# Patient Record
Sex: Male | Born: 1975 | Race: White | Hispanic: No | Marital: Married | Smoking: Former smoker
Health system: Southern US, Community
[De-identification: ages and names within clinical notes are randomized; demographics above are authoritative.]

## PROBLEM LIST (undated history)

## (undated) DIAGNOSIS — B019 Varicella without complication: Secondary | ICD-10-CM

## (undated) HISTORY — PX: WISDOM TOOTH EXTRACTION: SHX21

## (undated) HISTORY — DX: Varicella without complication: B01.9

---

## 1998-07-11 ENCOUNTER — Emergency Department (HOSPITAL_COMMUNITY): Admission: EM | Admit: 1998-07-11 | Discharge: 1998-07-11 | Payer: Self-pay | Admitting: Emergency Medicine

## 2001-02-03 ENCOUNTER — Emergency Department (HOSPITAL_COMMUNITY): Admission: EM | Admit: 2001-02-03 | Discharge: 2001-02-04 | Payer: Self-pay | Admitting: Emergency Medicine

## 2008-05-11 ENCOUNTER — Emergency Department (HOSPITAL_BASED_OUTPATIENT_CLINIC_OR_DEPARTMENT_OTHER): Admission: EM | Admit: 2008-05-11 | Discharge: 2008-05-11 | Payer: Self-pay | Admitting: Emergency Medicine

## 2014-03-10 ENCOUNTER — Encounter: Payer: Self-pay | Admitting: Internal Medicine

## 2014-03-10 ENCOUNTER — Ambulatory Visit (INDEPENDENT_AMBULATORY_CARE_PROVIDER_SITE_OTHER): Payer: No Typology Code available for payment source | Admitting: Internal Medicine

## 2014-03-10 ENCOUNTER — Telehealth: Payer: Self-pay | Admitting: Internal Medicine

## 2014-03-10 ENCOUNTER — Ambulatory Visit
Admission: RE | Admit: 2014-03-10 | Discharge: 2014-03-10 | Disposition: A | Discharge status: Home / Self Care | Payer: No Typology Code available for payment source | Source: Ambulatory Visit | Attending: Internal Medicine | Admitting: Internal Medicine

## 2014-03-10 VITALS — BP 118/78 | HR 64 | Temp 98.1°F | Ht 72.33 in | Wt 190.5 lb

## 2014-03-10 DIAGNOSIS — M25429 Effusion, unspecified elbow: Secondary | ICD-10-CM

## 2014-03-10 DIAGNOSIS — M25521 Pain in right elbow: Secondary | ICD-10-CM

## 2014-03-10 DIAGNOSIS — M25529 Pain in unspecified elbow: Secondary | ICD-10-CM

## 2014-03-10 DIAGNOSIS — M25421 Effusion, right elbow: Secondary | ICD-10-CM

## 2014-03-10 DIAGNOSIS — Z23 Encounter for immunization: Secondary | ICD-10-CM

## 2014-03-10 NOTE — Progress Notes (Signed)
Pre visit review using our clinic review tool, if applicable. No additional management support is needed unless otherwise documented below in the visit note. 

## 2014-03-10 NOTE — Patient Instructions (Addendum)
Olecranon Bursitis Bursitis is swelling and soreness (inflammation) of a fluid-filled sac (bursa) that covers and protects a joint. Olecranon bursitis occurs over the elbow.  CAUSES Bursitis can be caused by injury, overuse of the joint, arthritis, or infection.  SYMPTOMS   Tenderness, swelling, warmth, or redness over the elbow.  Elbow pain with movement. This is greater with bending the elbow.  Squeaking sound when the bursa is rubbed or moved.  Increasing size of the bursa without pain or discomfort.  Fever with increasing pain and swelling if the bursa becomes infected. HOME CARE INSTRUCTIONS   Put ice on the affected area.  Put ice in a plastic bag.  Place a towel between your skin and the bag.  Leave the ice on for 15-20 minutes each hour while awake. Do this for the first 2 days.  When resting, elevate your elbow above the level of your heart. This helps reduce swelling.  Continue to put the joint through a full range of motion 4 times per day. Rest the injured joint at other times. When the pain lessens, begin normal slow movements and usual activities.  Only take over-the-counter or prescription medicines for pain, discomfort, or fever as directed by your caregiver.  Reduce your intake of milk and related dairy products (cheese, yogurt). They may make your condition worse. SEEK IMMEDIATE MEDICAL CARE IF:   Your pain increases even during treatment.  You have a fever.  You have heat and inflammation over the bursa and elbow.  You have a red line that goes up your arm.  You have pain with movement of your elbow. MAKE SURE YOU:   Understand these instructions.  Will watch your condition.  Will get help right away if you are not doing well or get worse. Document Released: 11/15/2006 Document Revised: 01/08/2012 Document Reviewed: 10/01/2007 Exit[employer] Patient Information 2014 [employer].  

## 2014-03-10 NOTE — Telephone Encounter (Signed)
Relevant patient education mailed to patient.  

## 2014-03-10 NOTE — Addendum Note (Signed)
Addended by: Ellamae Sia on: 03/10/2014 11:44 AM   Modules accepted: Orders

## 2014-03-10 NOTE — Addendum Note (Signed)
Addended by: Lurlean Nanny on: 03/10/2014 11:55 AM   Modules accepted: Orders

## 2014-03-10 NOTE — Progress Notes (Signed)
   Subjective:    Patient ID: Keith Coleman, male    DOB: Sep 13, 1976, 38 y.o.   MRN: [id number]  HPI  Pt presents to the clinic today to establish care. He has not had a PCP in many years. He does have some concerns today about pain/swelling of his right elbow. He has noticed this since this last winter. Sometimes it swells more than others. He has not tried anything to fix it OTC.  Flu: never Tetanus: more than 10 years ago Dentist: as needed  Review of Systems      Past Medical History  Diagnosis Date  . Chicken pox     No current outpatient prescriptions on file.   No current facility-administered medications for this visit.    Allergies  Allergen Reactions  . Penicillins Hives    Family History  Problem Relation Age of Onset  . Alcohol abuse Father   . Cancer Father     colon  . Cancer Maternal Grandfather     liver    History   Social History  . Marital Status: Single    Spouse Name: N/A    Number of Children: N/A  . Years of Education: N/A   Occupational History  . Not on file.   Social History Main Topics  . Smoking status: Current Every Day Smoker -- 0.50 packs/day    Types: Cigarettes  . Smokeless tobacco: Former Systems developer    Types: Snuff  . Alcohol Use: Yes     Comment: occasional  . Drug Use: No  . Sexual Activity: Not on file   Other Topics Concern  . Not on file   Social History Narrative  . No narrative on file     Constitutional: Denies fever, malaise, fatigue, headache or abrupt weight changes.  Musculoskeletal: Pt reports right elbow swelling. Denies decrease in range of motion, difficulty with gait, muscle pain.   No other specific complaints in a complete review of systems (except as listed in HPI above).  Objective:   Physical Exam   BP 118/78  Pulse 64  Temp(Src) 98.1 F (36.7 C) (Oral)  Ht 6' 0.33" (1.837 m)  Wt 190 lb 8 oz (86.41 kg)  BMI 25.61 kg/m2 Wt Readings from Last 3 Encounters:  03/10/14 190 lb 8 oz  (86.41 kg)    General: Appears his stated age, well developed, well nourished in NAD. Skin: Warm, dry and intact. No rashes, lesions or ulcerations noted. Cardiovascular: Normal rate and rhythm. S1,S2 noted.  No murmur, rubs or gallops noted. No JVD or BLE edema. No carotid bruits noted. Pulmonary/Chest: Normal effort and positive vesicular breath sounds. No respiratory distress. No wheezes, rales or ronchi noted.  Musculoskeletal: Normal flexion and extension of the right elbow. + effusion noted. Palpable floating mass noted.         Assessment & Plan:   Right elbow effusion/pain:  Will obtain xray of right elbow to r/o foreign body vs broken bone Aleve as needed for pain/inflammation See procedure note for aspiration of effusion  Aspiration of olecranon bursitis:  Discussed risk of procedure with patient including reaccumulation of fluid, pain, possible infection and bleeding Pt understands and verbally agrees Right elbow cleaned with betadine x 2 Approx 5 ml serous fluid aspirated using 21 g needle Pt tolerated procedure well Area covered with triple antibiotic ointment and bandaid  RTC for your annual physical at your convienince

## 2016-07-28 DIAGNOSIS — M545 Low back pain: Secondary | ICD-10-CM | POA: Diagnosis not present

## 2016-07-28 DIAGNOSIS — M9903 Segmental and somatic dysfunction of lumbar region: Secondary | ICD-10-CM | POA: Diagnosis not present

## 2016-07-28 DIAGNOSIS — M9902 Segmental and somatic dysfunction of thoracic region: Secondary | ICD-10-CM | POA: Diagnosis not present

## 2016-07-28 DIAGNOSIS — M9901 Segmental and somatic dysfunction of cervical region: Secondary | ICD-10-CM | POA: Diagnosis not present

## 2017-04-02 ENCOUNTER — Ambulatory Visit: Payer: No Typology Code available for payment source | Admitting: Internal Medicine

## 2017-04-12 ENCOUNTER — Encounter: Payer: Self-pay | Admitting: Internal Medicine

## 2017-04-12 ENCOUNTER — Ambulatory Visit (INDEPENDENT_AMBULATORY_CARE_PROVIDER_SITE_OTHER): Payer: BLUE CROSS/BLUE SHIELD | Admitting: Internal Medicine

## 2017-04-12 VITALS — BP 114/78 | HR 60 | Temp 98.4°F | Ht 73.0 in | Wt 201.8 lb

## 2017-04-12 DIAGNOSIS — Z800 Family history of malignant neoplasm of digestive organs: Secondary | ICD-10-CM | POA: Diagnosis not present

## 2017-04-12 DIAGNOSIS — Z0000 Encounter for general adult medical examination without abnormal findings: Secondary | ICD-10-CM | POA: Diagnosis not present

## 2017-04-12 DIAGNOSIS — H9312 Tinnitus, left ear: Secondary | ICD-10-CM | POA: Diagnosis not present

## 2017-04-12 LAB — CBC
HEMATOCRIT: 43.2 % (ref 39.0–52.0)
Hemoglobin: 15.4 g/dL (ref 13.0–17.0)
MCHC: 35.5 g/dL (ref 30.0–36.0)
MCV: 96.8 fl (ref 78.0–100.0)
Platelets: 261 10*3/uL (ref 150.0–400.0)
RBC: 4.47 Mil/uL (ref 4.22–5.81)
RDW: 12.7 % (ref 11.5–15.5)
WBC: 3.9 10*3/uL — ABNORMAL LOW (ref 4.0–10.5)

## 2017-04-12 LAB — COMPREHENSIVE METABOLIC PANEL
ALK PHOS: 38 U/L — AB (ref 39–117)
ALT: 21 U/L (ref 0–53)
AST: 18 U/L (ref 0–37)
Albumin: 4.5 g/dL (ref 3.5–5.2)
BUN: 16 mg/dL (ref 6–23)
CO2: 28 meq/L (ref 19–32)
Calcium: 9.9 mg/dL (ref 8.4–10.5)
Chloride: 101 mEq/L (ref 96–112)
Creatinine, Ser: 1.18 mg/dL (ref 0.40–1.50)
GFR: 72.61 mL/min (ref >60.00)
Glucose, Bld: 92 mg/dL (ref 70–99)
POTASSIUM: 4.5 meq/L (ref 3.5–5.1)
Sodium: 136 mEq/L (ref 135–145)
Total Bilirubin: 0.6 mg/dL (ref 0.2–1.2)
Total Protein: 7.4 g/dL (ref 6.0–8.3)

## 2017-04-12 LAB — LIPID PANEL
CHOLESTEROL: 197 mg/dL (ref 0–200)
HDL: 55.9 mg/dL (ref >39.00)
LDL Cholesterol: 127 mg/dL — ABNORMAL HIGH (ref 0–99)
NONHDL: 140.6
Total CHOL/HDL Ratio: 4
Triglycerides: 66 mg/dL (ref 0.0–149.0)
VLDL: 13.2 mg/dL (ref 0.0–40.0)

## 2017-04-12 NOTE — Progress Notes (Signed)
HPI  Pt presents to the clinic today to Keith Coleman care. He has not been seen in this clinic or anywhere else in the last 3 years.  Flu: never Tetanus: 2015 Vision Screening: as needed Dentist: biannually  He does c/o intermittent ringing in his left ear. He reports this started after a weapon discharged while he was serving in the [employer] 2000-2004. It is not bothering him now, he reports he just wanted to mention it for the record.  He also reports he has a strong family history of colon cancer. His father and 2 of his uncles have all had colon cancer. He is interested in getting colon cancer screening now that he is 3.  Diet: He does eat meat. He consumes fruits and veggies daily. He occasionally eats frieds foods. He drinks mostly water. Exercise: [employer] 3-4 days a week  Past Medical History:  Diagnosis Date  . Chicken pox     Current Outpatient Prescriptions  Medication Sig Dispense Refill  . Creatine POWD Take by mouth.    . Multiple Vitamin (MULTIVITAMIN) tablet Take 1 tablet by mouth daily.    . Omega-3 1000 MG CAPS Take 2 capsules by mouth daily.    Marland Kitchen OVER THE COUNTER MEDICATION Tumeric    . Protein POWD Take by mouth.     No current facility-administered medications for this visit.     No Known Allergies  Family History  Problem Relation Age of Onset  . Alcohol abuse Father   . Cancer Father        colon  . Cancer Maternal Grandfather        liver    Social History   Social History  . Marital status: Single    Spouse name: N/A  . Number of children: N/A  . Years of education: N/A   Occupational History  . Not on file.   Social History Main Topics  . Smoking status: Former Smoker    Packs/day: 0.50    Years: 15.00    Types: Cigarettes  . Smokeless tobacco: Former Systems developer    Types: Snuff     Comment: quit 2016  . Alcohol use 3.0 oz/week    5 Cans of beer per week     Comment: occasional  . Drug use: No  . Sexual activity: Yes   Other Topics  Concern  . Not on file   Social History Narrative  . No narrative on file    ROS:  Constitutional: Denies fever, malaise, fatigue, headache or abrupt weight changes.  HEENT: Pt reports ringing in left ear. Denies eye pain, eye redness, ear pain, wax buildup, runny nose, nasal congestion, bloody nose, or sore throat. Respiratory: Denies difficulty breathing, shortness of breath, cough or sputum production.   Cardiovascular: Denies chest pain, chest tightness, palpitations or swelling in the hands or feet.  Gastrointestinal: Denies abdominal pain, bloating, constipation, diarrhea or blood in the stool.  GU: Denies frequency, urgency, pain with urination, blood in urine, odor or discharge. Musculoskeletal: Denies decrease in range of motion, difficulty with gait, muscle pain or joint pain and swelling.  Skin: Denies redness, rashes, lesions or ulcercations.  Neurological: Denies dizziness, difficulty with memory, difficulty with speech or problems with balance and coordination.  Psych: Denies anxiety, depression, SI/HI.  No other specific complaints in a complete review of systems (except as listed in HPI above).  PE:  BP 114/78   Pulse 60   Temp 98.4 F (36.9 C) (Oral)   Ht _0  (  1.854 m)   Wt 201 lb 12 oz (91.5 kg)   SpO2 98%   BMI 26.62 kg/m  Wt Readings from Last 3 Encounters:  04/12/17 201 lb 12 oz (91.5 kg)  03/10/14 190 lb 8 oz (86.4 kg)    General: Appears his stated age, well developed, well nourished in NAD. HEENT: Head: normal shape and size; Eyes: sclera white, no icterus, conjunctiva pink, PERRLA and EOMs intact; Ears: Tm's gray and intact, normal light reflex; Throat/Mouth: Teeth present, mucosa pink and moist, no lesions or ulcerations noted.  Neck: Neck supple, trachea midline. No masses, lumps or thyromegaly present.  Cardiovascular: Normal rate and rhythm. S1,S2 noted.  No murmur, rubs or gallops noted. No JVD or BLE edema.  Pulmonary/Chest: Normal effort and  positive vesicular breath sounds. No respiratory distress. No wheezes, rales or ronchi noted.  Abdomen: Soft and nontender. Normal bowel sounds, no bruits noted. No distention or masses noted. Liver, spleen and kidneys non palpable. Musculoskeletal: Strength 5/5 BUE/BLE. No difficulty with gait.  Neurological: Alert and oriented. Cranial nerves II-XII grossly intact. Coordination normal.  Psychiatric: Mood and affect normal. Behavior is normal. Judgment and thought content normal.    Assessment and Plan:  Preventative Health Maintenance:  Encouraged him to get a flu shot in the fall Tetanus UTD Information given on Cologuard, will order and have kit sent to his house Encouraged him to consume a balanced diet and exercise regimen Advised him to see an eye doctor and dentist annually Will check CBC, CMET, Lipid profile today  RTC in 1 year, sooner if needed Webb Silversmith, NP

## 2017-04-12 NOTE — Patient Instructions (Signed)

## 2018-12-10 ENCOUNTER — Encounter: Payer: Self-pay | Admitting: Internal Medicine

## 2018-12-10 ENCOUNTER — Ambulatory Visit: Payer: BLUE CROSS/BLUE SHIELD | Admitting: Internal Medicine

## 2018-12-10 VITALS — BP 120/78 | HR 68 | Temp 98.7°F | Wt 211.0 lb

## 2018-12-10 DIAGNOSIS — R51 Headache: Secondary | ICD-10-CM

## 2018-12-10 DIAGNOSIS — J029 Acute pharyngitis, unspecified: Secondary | ICD-10-CM | POA: Diagnosis not present

## 2018-12-10 DIAGNOSIS — R519 Headache, unspecified: Secondary | ICD-10-CM

## 2018-12-10 NOTE — Patient Instructions (Signed)
Sore Throat  A sore throat is pain, burning, irritation, or scratchiness in the throat. When you have a sore throat, you may feel pain or tenderness in your throat when you swallow or talk.  Many things can cause a sore throat, including:   An infection.   Seasonal allergies.   Dryness in the air.   Irritants, such as smoke or pollution.   Radiation treatment to the area.   Gastroesophageal reflux disease (GERD).   A tumor.  A sore throat is often the first sign of another sickness. It may happen with other symptoms, such as coughing, sneezing, fever, and swollen neck glands. Most sore throats go away without medical treatment.  Follow these instructions at home:          Take over-the-counter medicines only as told by your health care provider.  ? If your child has a sore throat, do not give your child aspirin because of the association with Reye syndrome.   Drink enough fluids to keep your urine pale yellow.   Rest as needed.   To help with pain, try:  ? Sipping warm liquids, such as broth, herbal tea, or warm water.  ? Eating or drinking cold or frozen liquids, such as frozen ice pops.  ? Gargling with a salt-water mixture 3-4 times a day or as needed. To make a salt-water mixture, completely dissolve -1 tsp (3-6 g) of salt in 1 cup (237 mL) of warm water.  ? Sucking on hard candy or throat lozenges.  ? Putting a cool-mist humidifier in your bedroom at night to moisten the air.  ? Sitting in the bathroom with the door closed for 5-10 minutes while you run hot water in the shower.   Do not use any products that contain nicotine or tobacco, such as cigarettes, e-cigarettes, and chewing tobacco. If you need help quitting, ask your health care provider.   Wash your hands well and often with soap and water. If soap and water are not available, use hand sanitizer.  Contact a health care provider if:   You have a fever for more than 2-3 days.   You have symptoms that last (are persistent) for more than  2-3 days.   Your throat does not get better within 7 days.   You have a fever and your symptoms suddenly get worse.   Your child who is 3 months to 3 years old has a temperature of 102.2F (39C) or higher.  Get help right away if:   You have difficulty breathing.   You cannot swallow fluids, soft foods, or your saliva.   You have increased swelling in your throat or neck.   You have persistent nausea and vomiting.  Summary   A sore throat is pain, burning, irritation, or scratchiness in the throat. Many things can cause a sore throat.   Take over-the-counter medicines only as told by your health care provider. Do not give your child aspirin.   Drink plenty of fluids, and rest as needed.   Contact a health care provider if your symptoms worsen or your sore throat does not get better within 7 days.  This information is not intended to replace advice given to you by your health care provider. Make sure you discuss any questions you have with your health care provider.  Document Released: 11/23/2004 Document Revised: 03/18/2018 Document Reviewed: 03/18/2018  Elsevier Interactive Patient Education  2019 [employer]

## 2018-12-10 NOTE — Progress Notes (Signed)
Subjective:    Patient ID: Keith Coleman, male    DOB: [birth year], 43 y.o.   MRN: [id number]  HPI  Pt presents to the clinic today with c/o headache and sore throat. He reports this started 2-3 days ago. The headache is located. He describes the pain as pressure located in the forehead. He denies visual changes, dizziness, sensitivity to light or sound. He denies difficulty swallowing but is having pain with swallowing. He denies runny nose, nasal congestion, ear pain or cough. He denies fever, chills or body aches. He has tried Ibuprofen and Nyquil with minimal relief. He has had sick contacts. He also recently cleaned out his chicken coop.  Review of Systems      Past Medical History:  Diagnosis Date  . Chicken pox     Current Outpatient Medications  Medication Sig Dispense Refill  . Creatine POWD Take by mouth.    . ELDERBERRY PO Take by mouth.    . Multiple Vitamin (MULTIVITAMIN) tablet Take 1 tablet by mouth daily.    . Omega-3 1000 MG CAPS Take 2 capsules by mouth daily.    Marland Kitchen OVER THE COUNTER MEDICATION Tumeric    . Protein POWD Take by mouth.     No current facility-administered medications for this visit.     Allergies  Allergen Reactions  . Penicillins Hives    Family History  Problem Relation Age of Onset  . Alcohol abuse Father   . Cancer Father        colon  . Cancer Maternal Grandfather        liver    Social History   Socioeconomic History  . Marital status: Married    Spouse name: Not on file  . Number of children: Not on file  . Years of education: Not on file  . Highest education level: Not on file  Occupational History  . Not on file  Social Needs  . Financial resource strain: Not on file  . Food insecurity:    Worry: Not on file    Inability: Not on file  . Transportation needs:    Medical: Not on file    Non-medical: Not on file  Tobacco Use  . Smoking status: Former Smoker    Packs/day: 0.50    Years: 15.00    Pack years: 7.50      Types: Cigarettes  . Smokeless tobacco: Former Systems developer    Types: Snuff  . Tobacco comment: quit 2016  Substance and Sexual Activity  . Alcohol use: Yes    Alcohol/week: 5.0 standard drinks    Types: 5 Cans of beer per week    Comment: occasional  . Drug use: No  . Sexual activity: Yes  Lifestyle  . Physical activity:    Days per week: Not on file    Minutes per session: Not on file  . Stress: Not on file  Relationships  . Social connections:    Talks on phone: Not on file    Gets together: Not on file    Attends religious service: Not on file    Active member of club or organization: Not on file    Attends meetings of clubs or organizations: Not on file    Relationship status: Not on file  . Intimate partner violence:    Fear of current or ex partner: Not on file    Emotionally abused: Not on file    Physically abused: Not on file    Forced sexual activity:  Not on file  Other Topics Concern  . Not on file  Social History Narrative  . Not on file     Constitutional: Pt reports headache. Denies fever, malaise, fatigue, or abrupt weight changes.  HEENT: Pt reports sore throat. Denies eye pain, eye redness, ear pain, ringing in the ears, wax buildup, runny nose, nasal congestion, bloody nose. Respiratory: Denies difficulty breathing, shortness of breath, cough or sputum production.   Cardiovascular: Denies chest pain, chest tightness, palpitations or swelling in the hands or feet.   No other specific complaints in a complete review of systems (except as listed in HPI above).  Objective:   Physical Exam  BP 120/78   Pulse 68   Temp 98.7 F (37.1 C) (Oral)   Wt 211 lb (95.7 kg)   SpO2 98%   BMI 27.84 kg/m  Wt Readings from Last 3 Encounters:  12/10/18 211 lb (95.7 kg)  04/12/17 201 lb 12 oz (91.5 kg)  03/10/14 190 lb 8 oz (86.4 kg)    General: Appears his stated age, well developed, well nourished in NAD. HEENT: Head: normal shape and size; Throat/Mouth: Teeth  present, mucosa erythematous and moist, + PND, no exudate, lesions or ulcerations noted.  Neck:  Bilateral anterior cervical adenopathy. Cardiovascular: Normal rate and rhythm. S1,S2 noted.  No murmur, rubs or gallops noted.  Pulmonary/Chest: Normal effort and positive vesicular breath sounds. No respiratory distress. No wheezes, rales or ronchi noted.   BMET    Component Value Date/Time   NA 136 04/12/2017 1018   K 4.5 04/12/2017 1018   CL 101 04/12/2017 1018   CO2 28 04/12/2017 1018   GLUCOSE 92 04/12/2017 1018   BUN 16 04/12/2017 1018   CREATININE 1.18 04/12/2017 1018   CALCIUM 9.9 04/12/2017 1018    Lipid Panel     Component Value Date/Time   CHOL 197 04/12/2017 1018   TRIG 66.0 04/12/2017 1018   HDL 55.90 04/12/2017 1018   CHOLHDL 4 04/12/2017 1018   VLDL 13.2 04/12/2017 1018   LDLCALC 127 (H) 04/12/2017 1018    CBC    Component Value Date/Time   WBC 3.9 (L) 04/12/2017 1018   RBC 4.47 04/12/2017 1018   HGB 15.4 04/12/2017 1018   HCT 43.2 04/12/2017 1018   PLT 261.0 04/12/2017 1018   MCV 96.8 04/12/2017 1018   MCHC 35.5 04/12/2017 1018   RDW 12.7 04/12/2017 1018    Hgb A1C No results found for: HGBA1C         Assessment & Plan:   Headache, Sore Throat:  RST: negative Will send throat culture Offered Depo Medrol 80 mg IM- he declines Suggested Zyrtec 10 mg daily x 10 days Salt water gargles as needed for sore throat Can take Ibuprofen 600 mg every 8 hours as needed for pain or inflammation  Return precautions discussed Webb Silversmith, NP

## 2018-12-10 NOTE — Addendum Note (Signed)
Addended by: Lurlean Nanny on: 12/10/2018 10:40 AM   Modules accepted: Orders

## 2018-12-12 LAB — CULTURE, GROUP A STREP
MICRO NUMBER: 179872
SPECIMEN QUALITY: ADEQUATE

## 2019-07-13 DIAGNOSIS — Z1159 Encounter for screening for other viral diseases: Secondary | ICD-10-CM | POA: Diagnosis not present

## 2019-07-13 DIAGNOSIS — R05 Cough: Secondary | ICD-10-CM | POA: Diagnosis not present

## 2019-08-22 DIAGNOSIS — H02825 Cysts of left lower eyelid: Secondary | ICD-10-CM | POA: Diagnosis not present

## 2019-09-26 DIAGNOSIS — D72819 Decreased white blood cell count, unspecified: Secondary | ICD-10-CM | POA: Diagnosis not present

## 2019-09-26 DIAGNOSIS — R351 Nocturia: Secondary | ICD-10-CM | POA: Diagnosis not present

## 2019-09-26 DIAGNOSIS — M7020 Olecranon bursitis, unspecified elbow: Secondary | ICD-10-CM | POA: Diagnosis not present

## 2019-09-26 DIAGNOSIS — Z800 Family history of malignant neoplasm of digestive organs: Secondary | ICD-10-CM | POA: Diagnosis not present

## 2019-09-26 DIAGNOSIS — Z0000 Encounter for general adult medical examination without abnormal findings: Secondary | ICD-10-CM | POA: Diagnosis not present

## 2019-09-26 DIAGNOSIS — E663 Overweight: Secondary | ICD-10-CM | POA: Diagnosis not present

## 2019-09-26 DIAGNOSIS — Z1331 Encounter for screening for depression: Secondary | ICD-10-CM | POA: Diagnosis not present

## 2019-10-30 ENCOUNTER — Encounter: Payer: Self-pay | Admitting: Gastroenterology

## 2019-11-06 ENCOUNTER — Other Ambulatory Visit: Payer: Self-pay

## 2019-11-06 ENCOUNTER — Ambulatory Visit: Payer: BC Managed Care – PPO | Admitting: *Deleted

## 2019-11-06 VITALS — Temp 98.2°F | Ht 73.0 in | Wt 210.0 lb

## 2019-11-06 DIAGNOSIS — Z1211 Encounter for screening for malignant neoplasm of colon: Secondary | ICD-10-CM

## 2019-11-06 DIAGNOSIS — Z800 Family history of malignant neoplasm of digestive organs: Secondary | ICD-10-CM

## 2019-11-06 DIAGNOSIS — Z1159 Encounter for screening for other viral diseases: Secondary | ICD-10-CM

## 2019-11-06 MED ORDER — SUPREP BOWEL PREP KIT 17.5-3.13-1.6 GM/177ML PO SOLN: 1.0000 | Freq: Once | ORAL | 0 refills | Status: AC

## 2019-11-13 ENCOUNTER — Encounter: Payer: Self-pay | Admitting: Gastroenterology

## 2019-11-17 ENCOUNTER — Other Ambulatory Visit: Payer: Self-pay | Admitting: Gastroenterology

## 2019-11-17 ENCOUNTER — Ambulatory Visit (INDEPENDENT_AMBULATORY_CARE_PROVIDER_SITE_OTHER): Payer: BC Managed Care – PPO

## 2019-11-17 DIAGNOSIS — Z1159 Encounter for screening for other viral diseases: Secondary | ICD-10-CM | POA: Diagnosis not present

## 2019-11-17 LAB — SARS CORONAVIRUS 2 (TAT 6-24 HRS): SARS Coronavirus 2: NEGATIVE

## 2019-11-20 ENCOUNTER — Ambulatory Visit (AMBULATORY_SURGERY_CENTER): Payer: BC Managed Care – PPO | Admitting: Gastroenterology

## 2019-11-20 ENCOUNTER — Encounter: Payer: Self-pay | Admitting: Gastroenterology

## 2019-11-20 ENCOUNTER — Other Ambulatory Visit: Payer: Self-pay

## 2019-11-20 VITALS — BP 123/61 | HR 56 | Temp 98.6°F | Resp 11 | Ht 73.0 in | Wt 210.0 lb

## 2019-11-20 DIAGNOSIS — Z1211 Encounter for screening for malignant neoplasm of colon: Secondary | ICD-10-CM

## 2019-11-20 DIAGNOSIS — Z800 Family history of malignant neoplasm of digestive organs: Secondary | ICD-10-CM

## 2019-11-20 DIAGNOSIS — D129 Benign neoplasm of anus and anal canal: Secondary | ICD-10-CM

## 2019-11-20 DIAGNOSIS — K635 Polyp of colon: Secondary | ICD-10-CM | POA: Diagnosis not present

## 2019-11-20 DIAGNOSIS — D125 Benign neoplasm of sigmoid colon: Secondary | ICD-10-CM

## 2019-11-20 DIAGNOSIS — K621 Rectal polyp: Secondary | ICD-10-CM | POA: Diagnosis not present

## 2019-11-20 DIAGNOSIS — D128 Benign neoplasm of rectum: Secondary | ICD-10-CM

## 2019-11-20 DIAGNOSIS — D127 Benign neoplasm of rectosigmoid junction: Secondary | ICD-10-CM | POA: Diagnosis not present

## 2019-11-20 MED ORDER — SODIUM CHLORIDE 0.9 % IV SOLN: 500.0000 mL | Freq: Once | INTRAVENOUS | Status: DC

## 2019-11-20 NOTE — Op Note (Signed)
[facility] Patient Name: Keith Coleman Procedure Date: 11/20/2019 9:28 AM MRN: [place]:[id number] Endoscopist: Thornton Park MD, MD Age: 44 Referring MD:  Date of Birth: August 30, 1976 Gender: Male Account #: [id number] Procedure:                Colonoscopy Indications:              Screening in patient at increased risk: Family                            history of 1st-degree relative with colorectal                            cancer before age 10 years                           Father and two paternal uncles died of colon cancer                            around age 89. No screening colonoscopies performed                            prior to diagnosis. Medicines:                Monitored Anesthesia Care Procedure:                Pre-Anesthesia Assessment:                           - Prior to the procedure, a History and Physical                            was performed, and patient medications and                            allergies were reviewed. The patient's tolerance of                            previous anesthesia was also reviewed. The risks                            and benefits of the procedure and the sedation                            options and risks were discussed with the patient.                            All questions were answered, and informed consent                            was obtained. Prior Anticoagulants: The patient has                            taken no previous anticoagulant or antiplatelet  agents. ASA Grade Assessment: II - A patient with                            mild systemic disease. After reviewing the risks                            and benefits, the patient was deemed in                            satisfactory condition to undergo the procedure.                           After obtaining informed consent, the colonoscope                            was passed under direct vision. Throughout the           procedure, the patient's blood pressure, pulse, and                            oxygen saturations were monitored continuously. The                            Colonoscope was introduced through the anus and                            advanced to the the cecum, identified by                            appendiceal orifice and ileocecal valve. A second                            forward view of the right colon was performed. The                            colonoscopy was performed without difficulty. The                            patient tolerated the procedure well. The quality                            of the bowel preparation was good. The ileocecal                            valve, appendiceal orifice, and rectum were                            photographed. Scope In: 9:30:12 AM Scope Out: 9:50:10 AM Scope Withdrawal Time: 0 hours 16 minutes 19 seconds  Total Procedure Duration: 0 hours 19 minutes 58 seconds  Findings:                 A 2 mm polyp was found in the rectum. The polyp was  sessile. The polyp was removed with a cold snare.                            Resection and retrieval were complete. Estimated                            blood loss was minimal.                           A 2 mm polyp was found in the sigmoid colon. The                            polyp was sessile. The polyp was removed with a                            cold snare. Resection and retrieval were complete.                           The exam was otherwise without abnormality on                            direct and retroflexion views. Complications:            No immediate complications. Estimated blood loss:                            Minimal. Estimated Blood Loss:     Estimated blood loss was minimal. Impression:               - One 2 mm polyp in the rectum, removed with a cold                            snare. Resected and retrieved.                           - One 2 mm  polyp in the sigmoid colon, removed with                            a cold snare. Resected and retrieved.                           - The examination was otherwise normal on direct                            and retroflexion views. Recommendation:           - Patient has a contact number available for                            emergencies. The signs and symptoms of potential                            delayed complications were discussed with the  patient. Return to normal activities tomorrow.                            Written discharge instructions were provided to the                            patient.                           - Resume previous diet.                           - Continue present medications.                           - Await pathology results.                           - Repeat colonoscopy in 5 years for surveillance                            given the family history. Thornton Park MD, MD 11/20/2019 9:58:18 AM This report has been signed electronically.

## 2019-11-20 NOTE — Patient Instructions (Signed)
HANDOUTS PROVIDED ON: POLYPS  The polyps removed today have been sent for pathology.  The results can take 1-3 weeks to receive.  When your next colonoscopy should occur will be based on the pathology results.    You may resume your previous diet and medication schedule.  Thank you for allowing us to care for you today!!!  YOU HAD AN ENDOSCOPIC PROCEDURE TODAY AT THE [facility]:   Refer to the procedure report that was given to you for any specific questions about what was found during the examination.  If the procedure report does not answer your questions, please call your gastroenterologist to clarify.  If you requested that your care partner not be given the details of your procedure findings, then the procedure report has been included in a sealed envelope for you to review at your convenience later.  YOU SHOULD EXPECT: Some feelings of bloating in the abdomen. Passage of more gas than usual.  Walking can help get rid of the air that was put into your GI tract during the procedure and reduce the bloating. If you had a lower endoscopy (such as a colonoscopy or flexible sigmoidoscopy) you may notice spotting of blood in your stool or on the toilet paper. If you underwent a bowel prep for your procedure, you may not have a normal bowel movement for a few days.  Please Note:  You might notice some irritation and congestion in your nose or some drainage.  This is from the oxygen used during your procedure.  There is no need for concern and it should clear up in a day or so.  SYMPTOMS TO REPORT IMMEDIATELY:   Following lower endoscopy (colonoscopy or flexible sigmoidoscopy):  Excessive amounts of blood in the stool  Significant tenderness or worsening of abdominal pains  Swelling of the abdomen that is new, acute  Fever of 100F or higher  For urgent or emergent issues, a gastroenterologist can be reached at any hour by calling [phone].   DIET:  We do recommend a small  meal at first, but then you may proceed to your regular diet.  Drink plenty of fluids but you should avoid alcoholic beverages for 24 hours.  ACTIVITY:  You should plan to take it easy for the rest of today and you should NOT DRIVE or use heavy machinery until tomorrow (because of the sedation medicines used during the test).    FOLLOW UP: Our staff will call the number listed on your records 48-72 hours following your procedure to check on you and address any questions or concerns that you may have regarding the information given to you following your procedure. If we do not reach you, we will leave a message.  We will attempt to reach you two times.  During this call, we will ask if you have developed any symptoms of COVID 19. If you develop any symptoms (ie: fever, flu-like symptoms, shortness of breath, cough etc.) before then, please call [phone].  If you test positive for Covid 19 in the 2 weeks post procedure, please call and report this information to us.    If any biopsies were taken you will be contacted by phone or by letter within the next 1-3 weeks.  Please call us at [phone] if you have not heard about the biopsies in 3 weeks.    SIGNATURES/CONFIDENTIALITY: You and/or your care partner have signed paperwork which will be entered into your electronic medical record.  These signatures attest to the   fact that that the information above on your After Visit Summary has been reviewed and is understood.  Full responsibility of the confidentiality of this discharge information lies with you and/or your care-partner.

## 2019-11-20 NOTE — Progress Notes (Signed)
To PACU, VSS. Report to [email or web address] 

## 2019-11-20 NOTE — Progress Notes (Signed)
Called to room to assist during endoscopic procedure.  Patient ID and intended procedure confirmed with present staff. Received instructions for my participation in the procedure from the performing physician.  

## 2019-11-24 ENCOUNTER — Telehealth: Payer: Self-pay

## 2019-11-24 NOTE — Telephone Encounter (Signed)
  Follow up Call-  Call back number 11/20/2019  Post procedure Call Back phone  # [phone]  Permission to leave phone message Yes  Some recent data might be hidden     Patient questions:  Do you have a fever, pain , or abdominal swelling? No. Pain Score  0 *  Have you tolerated food without any problems? Yes.    Have you been able to return to your normal activities? Yes.    Do you have any questions about your discharge instructions: Diet   No. Medications  No. Follow up visit  No.  Do you have questions or concerns about your Care? No.  Actions: * If pain score is 4 or above: No action needed, pain <4.   1. Have you developed a fever since your procedure? No  2.   Have you had an respiratory symptoms (SOB or cough) since your procedure? No  3.   Have you tested positive for COVID 19 since your procedure No  4.   Have you had any family members/close contacts diagnosed with the COVID 19 since your procedure?  No   If yes to any of these questions please route to Joylene John, RN and Alphonsa Gin, RN.

## 2019-11-25 ENCOUNTER — Encounter: Payer: Self-pay | Admitting: Gastroenterology

## 2020-02-25 DIAGNOSIS — Z1379 Encounter for other screening for genetic and chromosomal anomalies: Secondary | ICD-10-CM | POA: Diagnosis not present

## 2020-03-19 DIAGNOSIS — Z0000 Encounter for general adult medical examination without abnormal findings: Secondary | ICD-10-CM | POA: Diagnosis not present

## 2020-03-19 DIAGNOSIS — E663 Overweight: Secondary | ICD-10-CM | POA: Diagnosis not present

## 2020-03-19 DIAGNOSIS — Z125 Encounter for screening for malignant neoplasm of prostate: Secondary | ICD-10-CM | POA: Diagnosis not present

## 2020-03-19 DIAGNOSIS — R7989 Other specified abnormal findings of blood chemistry: Secondary | ICD-10-CM | POA: Diagnosis not present

## 2020-03-26 DIAGNOSIS — E663 Overweight: Secondary | ICD-10-CM | POA: Diagnosis not present

## 2020-03-26 DIAGNOSIS — R351 Nocturia: Secondary | ICD-10-CM | POA: Diagnosis not present

## 2020-03-26 DIAGNOSIS — W57XXXA Bitten or stung by nonvenomous insect and other nonvenomous arthropods, initial encounter: Secondary | ICD-10-CM | POA: Diagnosis not present

## 2020-03-26 DIAGNOSIS — Z0000 Encounter for general adult medical examination without abnormal findings: Secondary | ICD-10-CM | POA: Diagnosis not present

## 2020-03-26 DIAGNOSIS — R7989 Other specified abnormal findings of blood chemistry: Secondary | ICD-10-CM | POA: Diagnosis not present

## 2020-03-26 DIAGNOSIS — Z1331 Encounter for screening for depression: Secondary | ICD-10-CM | POA: Diagnosis not present

## 2020-04-08 DIAGNOSIS — J40 Bronchitis, not specified as acute or chronic: Secondary | ICD-10-CM | POA: Diagnosis not present

## 2020-05-17 DIAGNOSIS — Z20822 Contact with and (suspected) exposure to covid-19: Secondary | ICD-10-CM | POA: Diagnosis not present

## 2020-05-17 DIAGNOSIS — Z03818 Encounter for observation for suspected exposure to other biological agents ruled out: Secondary | ICD-10-CM | POA: Diagnosis not present

## 2020-07-15 DIAGNOSIS — H6502 Acute serous otitis media, left ear: Secondary | ICD-10-CM | POA: Diagnosis not present

## 2020-07-23 DIAGNOSIS — H60332 Swimmer's ear, left ear: Secondary | ICD-10-CM | POA: Diagnosis not present

## 2020-10-15 DIAGNOSIS — H60332 Swimmer's ear, left ear: Secondary | ICD-10-CM | POA: Diagnosis not present

## 2020-10-15 DIAGNOSIS — H6123 Impacted cerumen, bilateral: Secondary | ICD-10-CM | POA: Diagnosis not present

## 2020-11-12 DIAGNOSIS — H6123 Impacted cerumen, bilateral: Secondary | ICD-10-CM | POA: Diagnosis not present

## 2020-11-12 DIAGNOSIS — H608X2 Other otitis externa, left ear: Secondary | ICD-10-CM | POA: Diagnosis not present

## 2021-04-04 DIAGNOSIS — D72819 Decreased white blood cell count, unspecified: Secondary | ICD-10-CM | POA: Diagnosis not present

## 2021-04-04 DIAGNOSIS — R7989 Other specified abnormal findings of blood chemistry: Secondary | ICD-10-CM | POA: Diagnosis not present

## 2021-04-08 DIAGNOSIS — Z0000 Encounter for general adult medical examination without abnormal findings: Secondary | ICD-10-CM | POA: Diagnosis not present

## 2021-04-08 DIAGNOSIS — R82998 Other abnormal findings in urine: Secondary | ICD-10-CM | POA: Diagnosis not present

## 2021-04-08 DIAGNOSIS — Z1331 Encounter for screening for depression: Secondary | ICD-10-CM | POA: Diagnosis not present

## 2021-04-08 DIAGNOSIS — R7989 Other specified abnormal findings of blood chemistry: Secondary | ICD-10-CM | POA: Diagnosis not present

## 2021-04-08 DIAGNOSIS — D72819 Decreased white blood cell count, unspecified: Secondary | ICD-10-CM | POA: Diagnosis not present

## 2021-04-08 DIAGNOSIS — Z1389 Encounter for screening for other disorder: Secondary | ICD-10-CM | POA: Diagnosis not present

## 2021-06-08 DIAGNOSIS — H60331 Swimmer's ear, right ear: Secondary | ICD-10-CM | POA: Diagnosis not present

## 2021-06-29 DIAGNOSIS — H608X3 Other otitis externa, bilateral: Secondary | ICD-10-CM | POA: Diagnosis not present

## 2021-08-02 DIAGNOSIS — M9904 Segmental and somatic dysfunction of sacral region: Secondary | ICD-10-CM | POA: Diagnosis not present

## 2021-08-02 DIAGNOSIS — D170 Benign lipomatous neoplasm of skin and subcutaneous tissue of head, face and neck: Secondary | ICD-10-CM | POA: Diagnosis not present

## 2021-08-02 DIAGNOSIS — M9903 Segmental and somatic dysfunction of lumbar region: Secondary | ICD-10-CM | POA: Diagnosis not present

## 2021-08-02 DIAGNOSIS — D72819 Decreased white blood cell count, unspecified: Secondary | ICD-10-CM | POA: Diagnosis not present

## 2021-08-02 DIAGNOSIS — M5451 Vertebrogenic low back pain: Secondary | ICD-10-CM | POA: Diagnosis not present

## 2021-08-02 DIAGNOSIS — M9901 Segmental and somatic dysfunction of cervical region: Secondary | ICD-10-CM | POA: Diagnosis not present

## 2021-08-03 ENCOUNTER — Other Ambulatory Visit: Payer: Self-pay | Admitting: Internal Medicine

## 2021-08-03 DIAGNOSIS — D170 Benign lipomatous neoplasm of skin and subcutaneous tissue of head, face and neck: Secondary | ICD-10-CM

## 2021-08-08 ENCOUNTER — Ambulatory Visit
Admission: RE | Admit: 2021-08-08 | Discharge: 2021-08-08 | Disposition: A | Discharge status: Home / Self Care | Payer: Self-pay | Source: Ambulatory Visit | Attending: Internal Medicine | Admitting: Internal Medicine

## 2021-08-08 DIAGNOSIS — R221 Localized swelling, mass and lump, neck: Secondary | ICD-10-CM | POA: Diagnosis not present

## 2021-08-08 DIAGNOSIS — D170 Benign lipomatous neoplasm of skin and subcutaneous tissue of head, face and neck: Secondary | ICD-10-CM

## 2021-08-08 DIAGNOSIS — M9903 Segmental and somatic dysfunction of lumbar region: Secondary | ICD-10-CM | POA: Diagnosis not present

## 2021-08-08 DIAGNOSIS — M9901 Segmental and somatic dysfunction of cervical region: Secondary | ICD-10-CM | POA: Diagnosis not present

## 2021-08-08 DIAGNOSIS — M5451 Vertebrogenic low back pain: Secondary | ICD-10-CM | POA: Diagnosis not present

## 2021-08-08 DIAGNOSIS — M9904 Segmental and somatic dysfunction of sacral region: Secondary | ICD-10-CM | POA: Diagnosis not present

## 2021-08-12 DIAGNOSIS — M9904 Segmental and somatic dysfunction of sacral region: Secondary | ICD-10-CM | POA: Diagnosis not present

## 2021-08-12 DIAGNOSIS — M9901 Segmental and somatic dysfunction of cervical region: Secondary | ICD-10-CM | POA: Diagnosis not present

## 2021-08-12 DIAGNOSIS — M5451 Vertebrogenic low back pain: Secondary | ICD-10-CM | POA: Diagnosis not present

## 2021-08-12 DIAGNOSIS — M9903 Segmental and somatic dysfunction of lumbar region: Secondary | ICD-10-CM | POA: Diagnosis not present

## 2021-08-12 DIAGNOSIS — D72819 Decreased white blood cell count, unspecified: Secondary | ICD-10-CM | POA: Diagnosis not present

## 2021-08-12 DIAGNOSIS — D170 Benign lipomatous neoplasm of skin and subcutaneous tissue of head, face and neck: Secondary | ICD-10-CM | POA: Diagnosis not present

## 2021-08-12 DIAGNOSIS — M9900 Segmental and somatic dysfunction of head region: Secondary | ICD-10-CM | POA: Diagnosis not present

## 2021-08-15 ENCOUNTER — Other Ambulatory Visit: Payer: Self-pay | Admitting: Internal Medicine

## 2021-08-15 DIAGNOSIS — M9903 Segmental and somatic dysfunction of lumbar region: Secondary | ICD-10-CM | POA: Diagnosis not present

## 2021-08-15 DIAGNOSIS — D170 Benign lipomatous neoplasm of skin and subcutaneous tissue of head, face and neck: Secondary | ICD-10-CM

## 2021-08-15 DIAGNOSIS — M9904 Segmental and somatic dysfunction of sacral region: Secondary | ICD-10-CM | POA: Diagnosis not present

## 2021-08-15 DIAGNOSIS — M5451 Vertebrogenic low back pain: Secondary | ICD-10-CM | POA: Diagnosis not present

## 2021-08-15 DIAGNOSIS — M9901 Segmental and somatic dysfunction of cervical region: Secondary | ICD-10-CM | POA: Diagnosis not present

## 2021-08-18 ENCOUNTER — Inpatient Hospital Stay: Admission: RE | Admit: 2021-08-18 | Payer: BC Managed Care – PPO | Source: Ambulatory Visit

## 2021-08-19 ENCOUNTER — Ambulatory Visit
Admission: RE | Admit: 2021-08-19 | Discharge: 2021-08-19 | Disposition: A | Discharge status: Home / Self Care | Payer: BC Managed Care – PPO | Source: Ambulatory Visit | Attending: Internal Medicine | Admitting: Internal Medicine

## 2021-08-19 DIAGNOSIS — D170 Benign lipomatous neoplasm of skin and subcutaneous tissue of head, face and neck: Secondary | ICD-10-CM

## 2021-08-19 DIAGNOSIS — M4802 Spinal stenosis, cervical region: Secondary | ICD-10-CM | POA: Diagnosis not present

## 2021-08-19 DIAGNOSIS — M47812 Spondylosis without myelopathy or radiculopathy, cervical region: Secondary | ICD-10-CM | POA: Diagnosis not present

## 2021-08-19 DIAGNOSIS — M4602 Spinal enthesopathy, cervical region: Secondary | ICD-10-CM | POA: Diagnosis not present

## 2021-08-19 MED ORDER — IOPAMIDOL (ISOVUE-300) INJECTION 61%
75.0000 mL | Freq: Once | INTRAVENOUS | Status: AC | PRN
  Administered 2021-08-19: 75 mL via INTRAVENOUS

## 2021-10-11 DIAGNOSIS — H608X3 Other otitis externa, bilateral: Secondary | ICD-10-CM | POA: Diagnosis not present

## 2021-10-11 DIAGNOSIS — H6123 Impacted cerumen, bilateral: Secondary | ICD-10-CM | POA: Diagnosis not present

## 2021-12-04 IMAGING — US US SOFT TISSUE HEAD/NECK
1 series · 10 of 10 positions shown · non-contrast
Comparison: None available.

CLINICAL DATA: 45-year-old male with posterior neck palpable
abnormality.

EXAM:
ULTRASOUND OF HEAD/NECK SOFT TISSUES
TECHNIQUE: Ultrasound examination of the head and neck soft tissues was
performed in the area of clinical concern.

[Series 1: us soft tissue head/neck · 0.06mm/px · 10 of 10 slices shown]
[im 1/10]
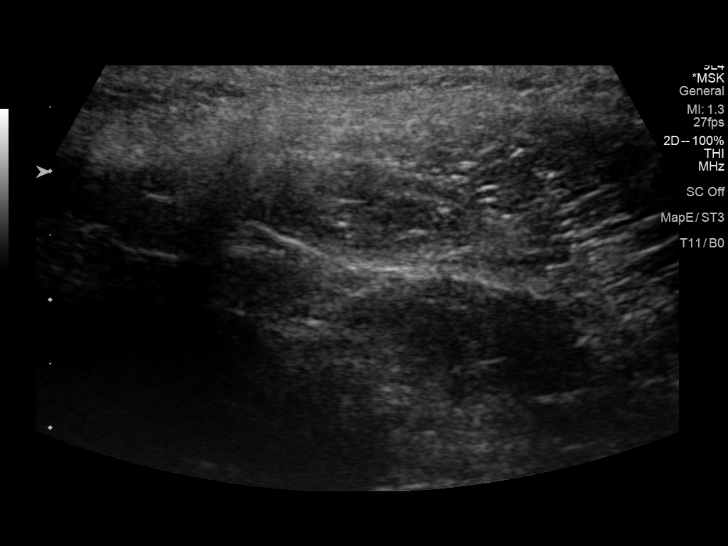
[im 2/10]
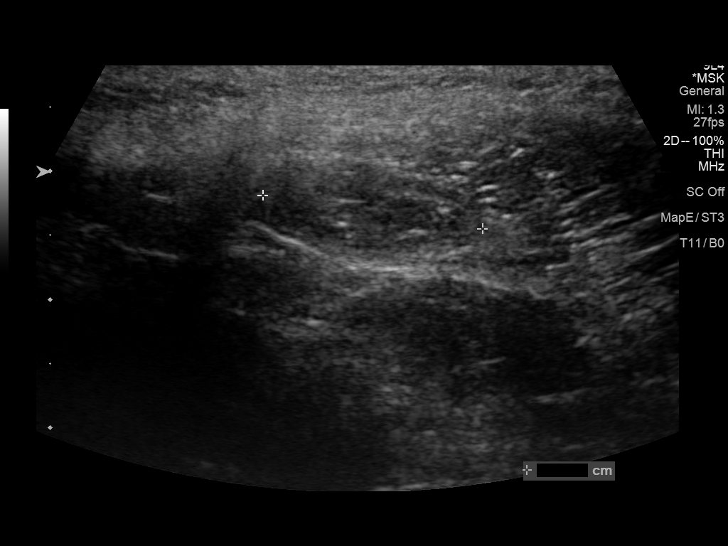
[im 3/10]
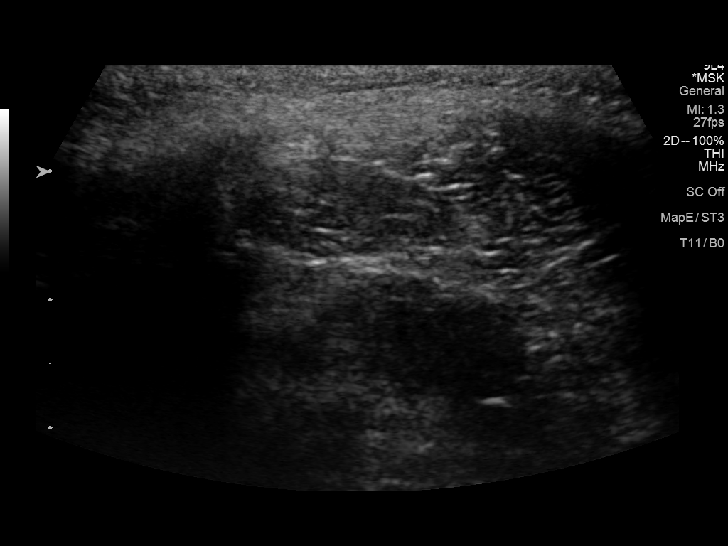
[im 4/10]
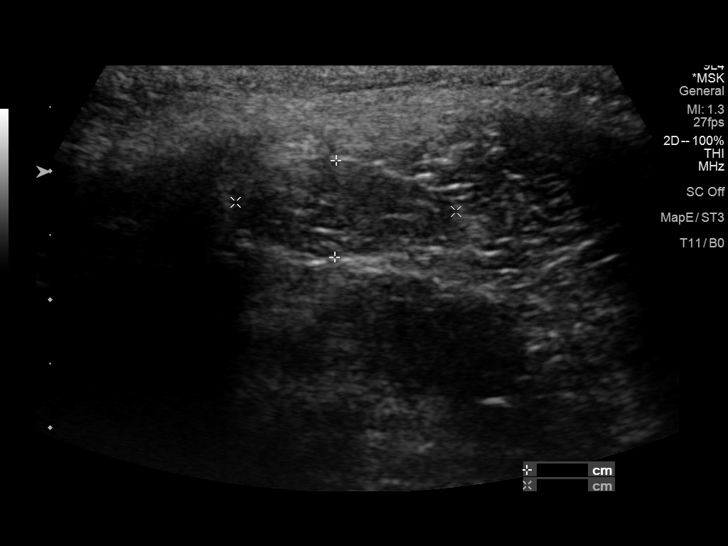
[im 5/10]
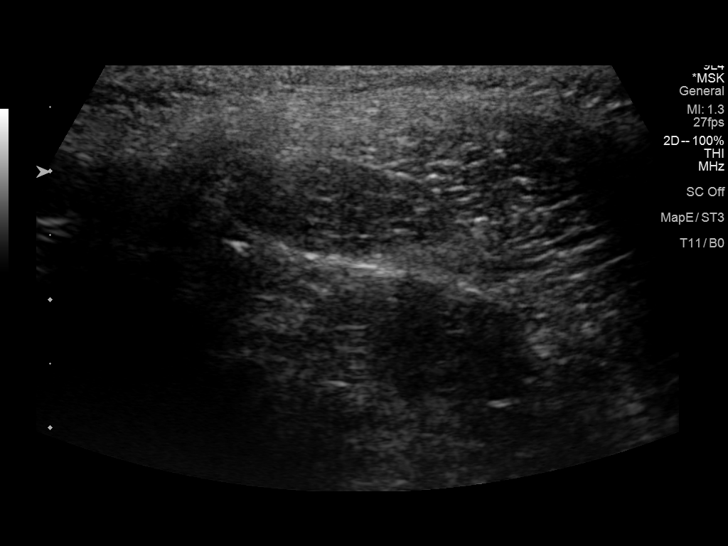
[im 6/10]
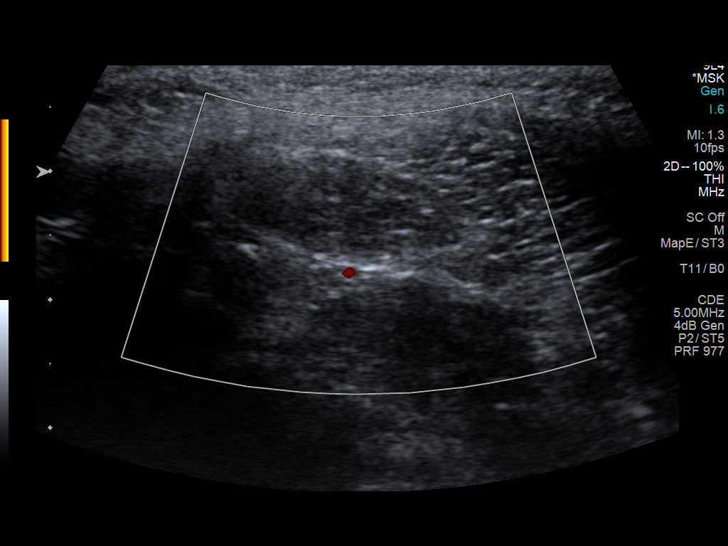
[im 7/10]
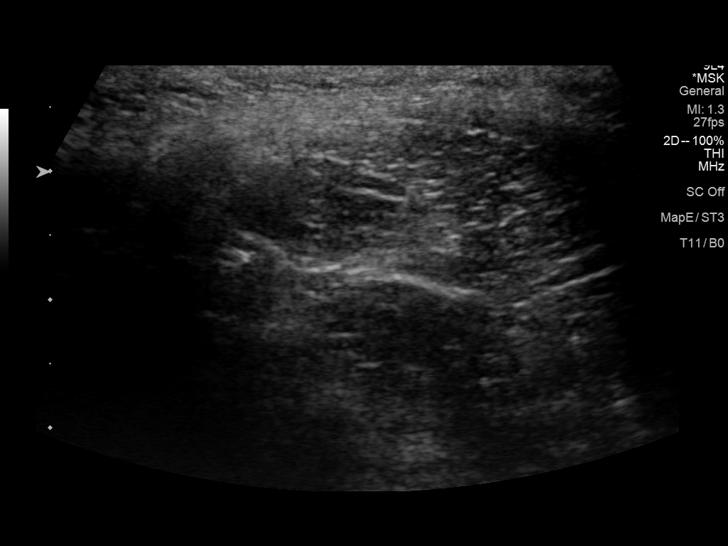
[im 8/10]
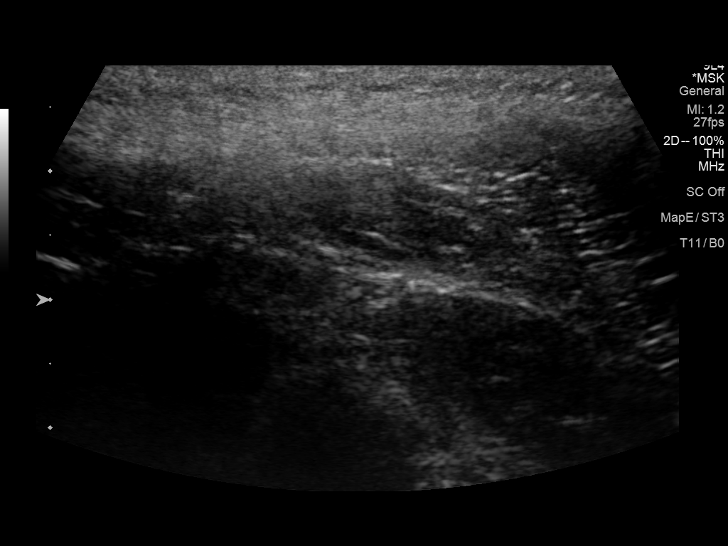
[im 9/10]
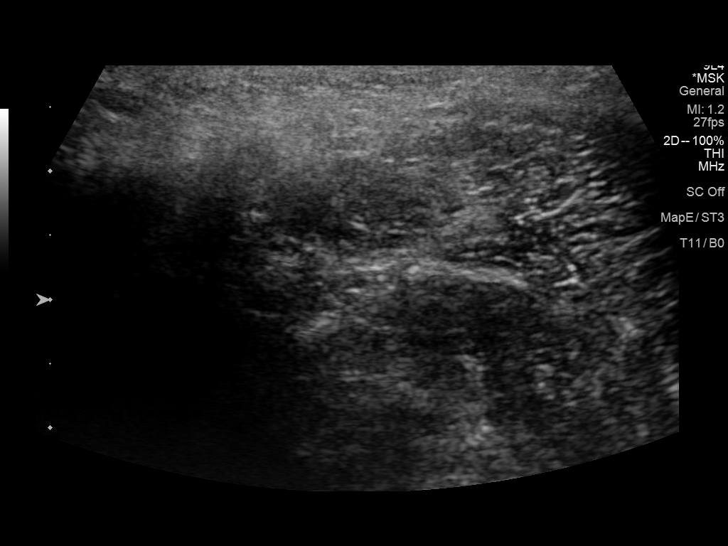
[im 10/10]
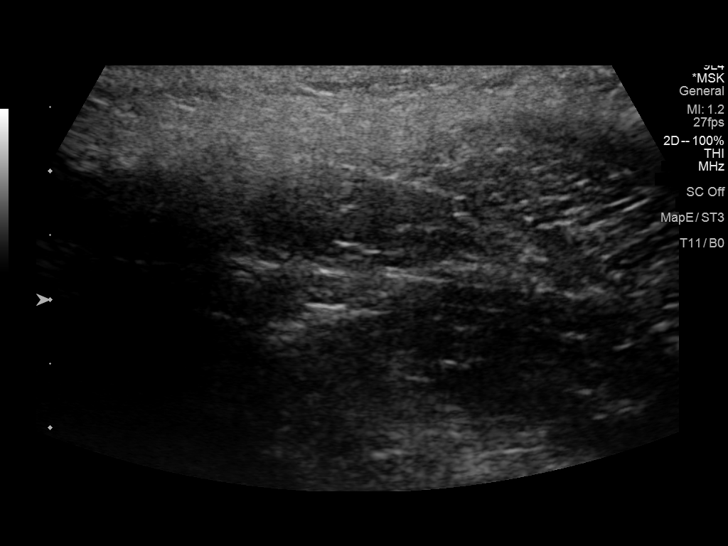

[10 of 10 positions shown; findings below may reference images not displayed]

FINDINGS: Grayscale and brief power Doppler images in the area of clinical
concern. About 12 mm deep from the skin surface there is an oval,
indistinct, mildly hypoechoic encapsulated area (image 5) measuring
17 x 8 x 17 mm. This seems to be within the superficial muscle
layer, deep to the subcutaneous fat. No hypervascularity on power
Doppler. No other regional soft tissue nodule or mass. Other
regional fibromuscular architecture appears within normal limits.
IMPRESSION: Oval 1.7 cm distinct area of nonspecific soft tissue, about 1.2 cm
deep from the skin surface in the palpable area. This seems to be
within muscle. An intramuscular lipoma is possible. No
hypervascularity or definite suspicious features by Ultrasound, but
recommend follow-up Neck CT (IV contrast preferred) to best
characterize.

## 2021-12-15 IMAGING — CT CT NECK W/ CM
5 of 6 series · 14 of 33 positions shown, 16 images · IV contrast (iopamidol)
Comparison: None.

CLINICAL DATA: Lipoma of neck. Headaches.

EXAM:
CT NECK WITH CONTRAST
TECHNIQUE: Multidetector CT imaging of the neck was performed using the
standard protocol following the bolus administration of intravenous
contrast.
CONTRAST:  75mL ABKB1Q-HBB IOPAMIDOL (ABKB1Q-HBB) INJECTION 61%

[Series 2: neck 2.00 br40 s3 st/ no angle · axial · 0.46mm/px · z∈[-782,-684]mm · 2 of 147 slices shown, 3 images]
[im 49/147  soft-tissue]
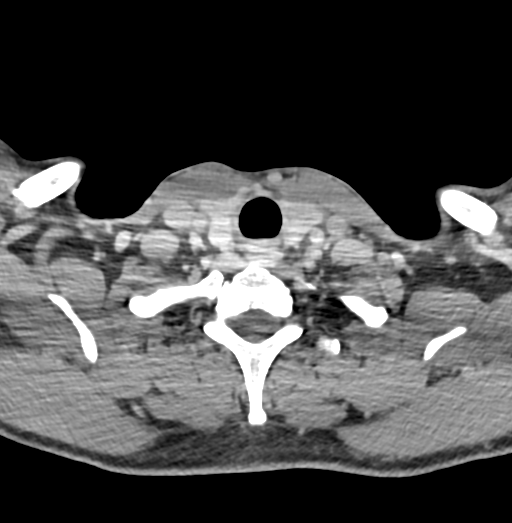
[im 49/147  bone]
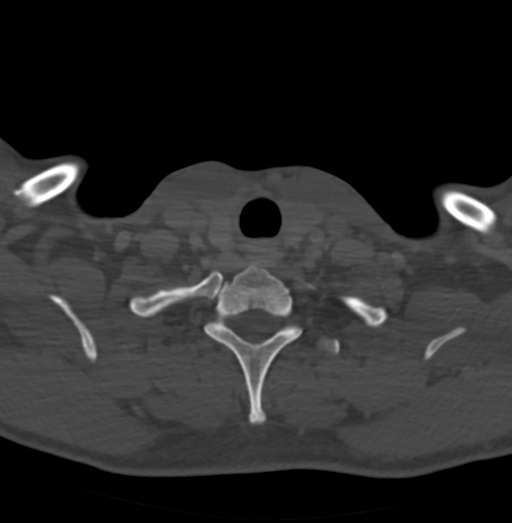
[im 98/147  bone]
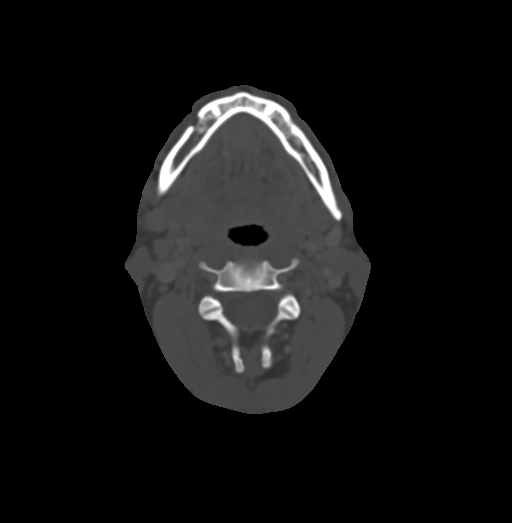

[Series 4: neck 2.00 br60 s3 bone/ no angle · axial · 0.46mm/px · z∈[-782,-684]mm · 2 of 147 slices shown]
[im 49/147  bone]
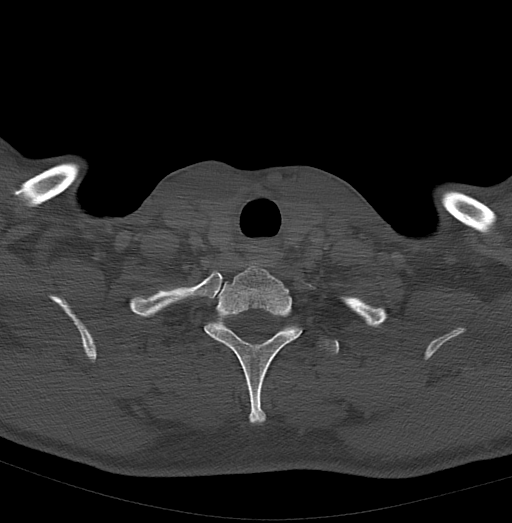
[im 98/147  bone]
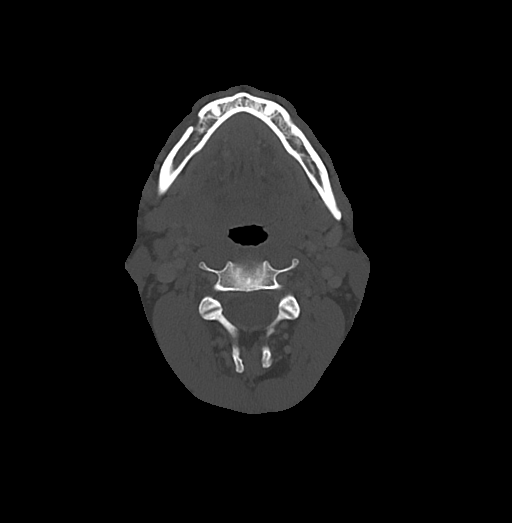

[Series 6: neck 2.00 br36 s3 angled axial (person_name) · axial · 0.46mm/px · z∈[-795,-697]mm · 2 of 147 slices shown]
[im 49/147  bone]
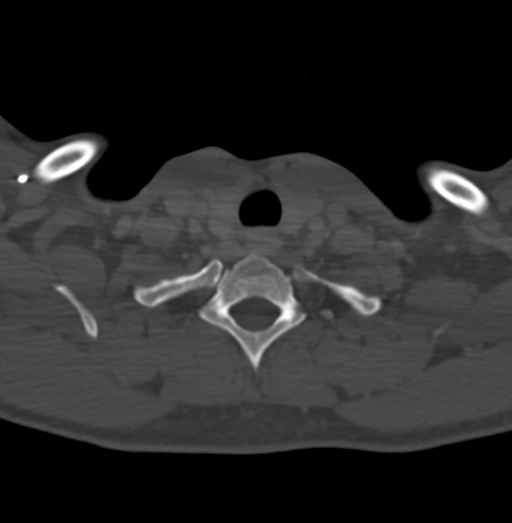
[im 98/147  bone]
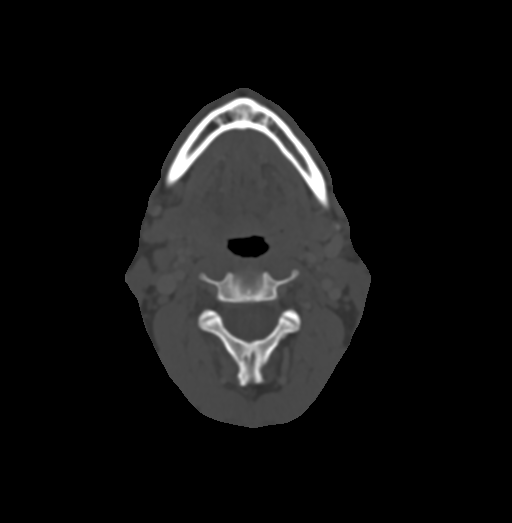

[Series 10: neck 2.00 br40 s3 (person_name) · coronal · 0.46mm/px · 3 of 138 slices shown (1 of 2)]
[im 28/138  bone]
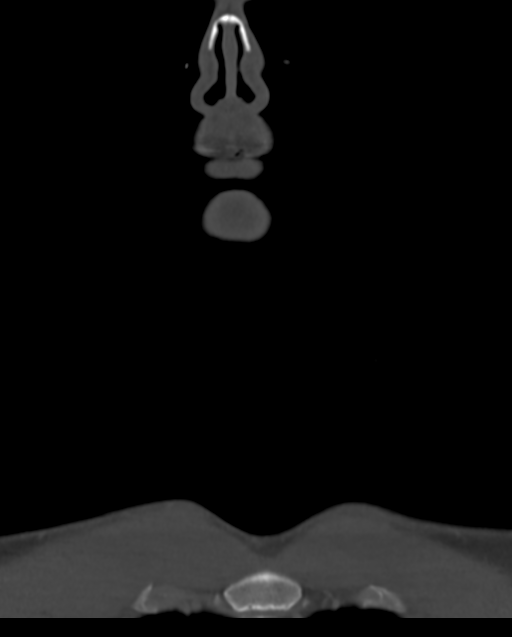
[im 55/138  bone]
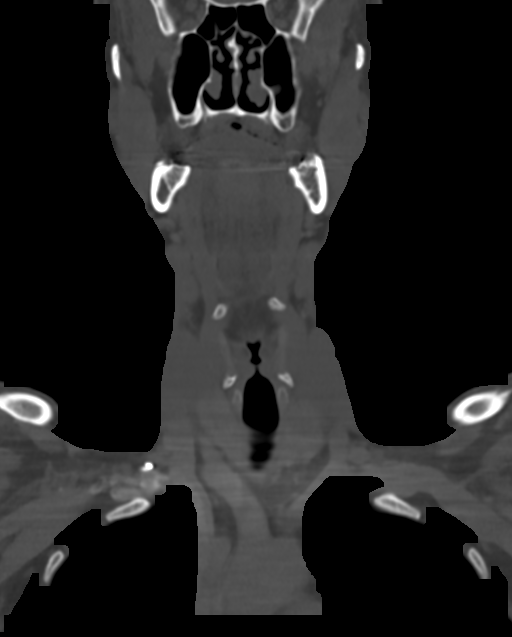
[im 83/138  bone]
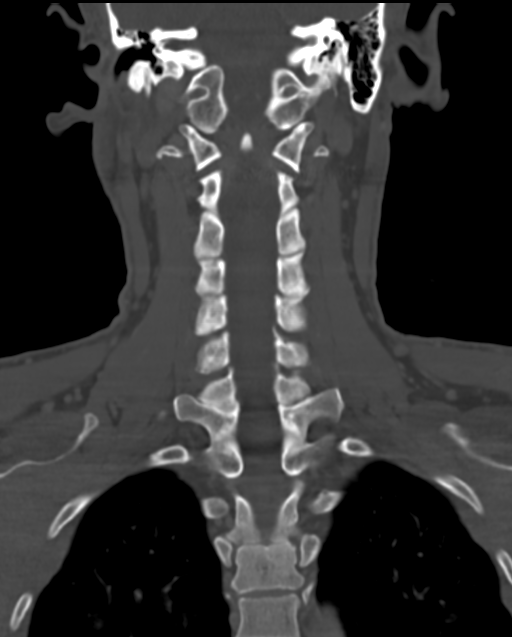

[Series 12: neck 2.00 br40 s3 (person_name) · sagittal · 0.47mm/px · 5 of 118 slices shown, 6 images (2 of 2)]
[im 40/118  bone]
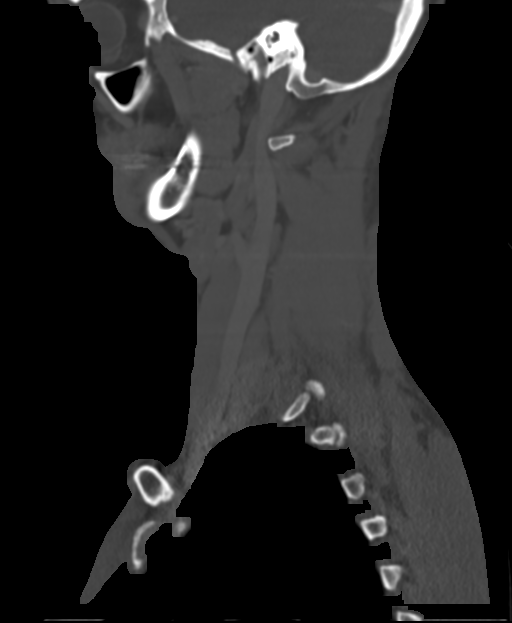
[im 49/118  bone]
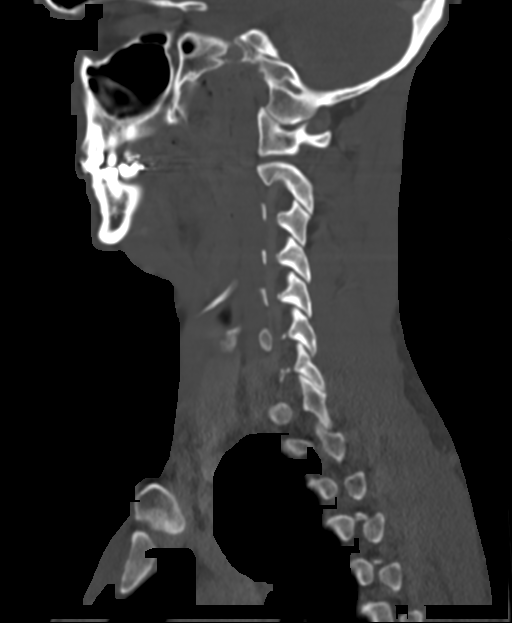
[im 59/118  soft-tissue]
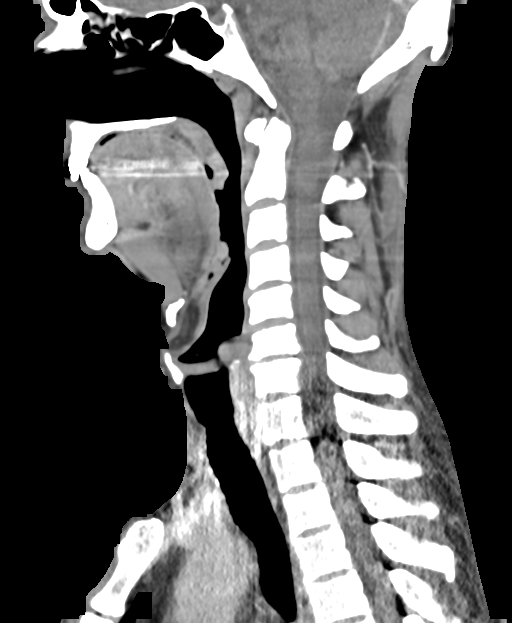
[im 59/118  bone]
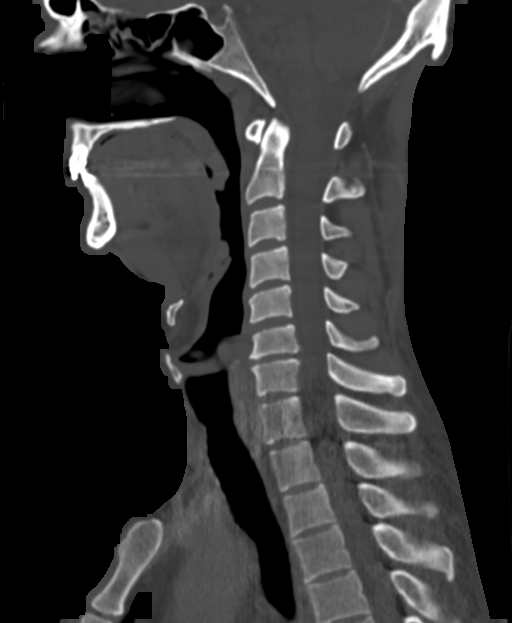
[im 69/118  bone]
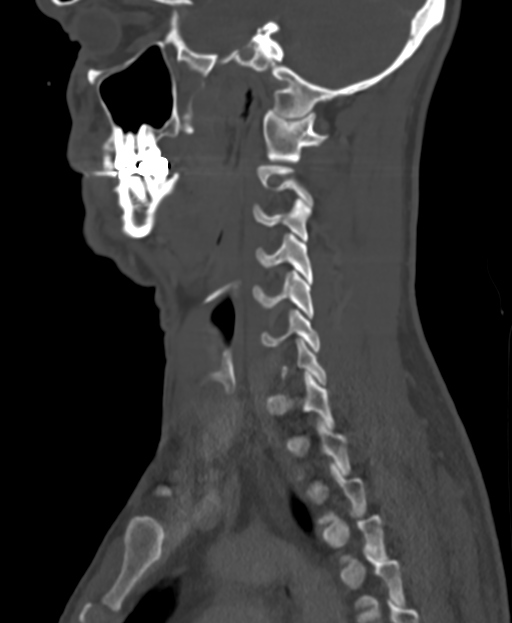
[im 79/118  bone]
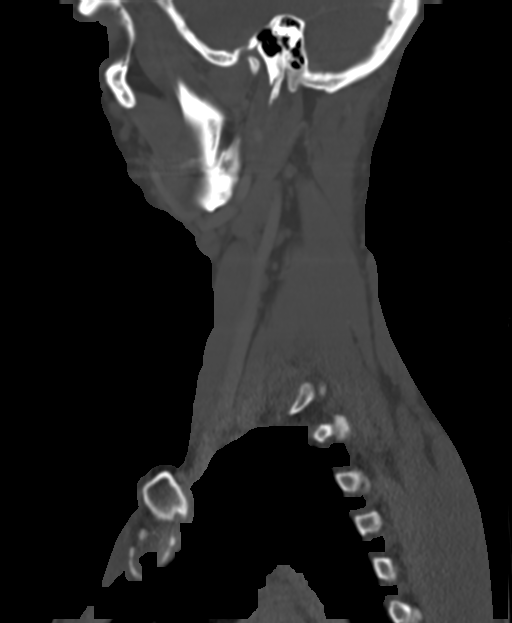

[14 of 33 positions shown; findings below may reference images not displayed]

FINDINGS: Pharynx and larynx: No focal mucosal or submucosal lesions are
present. Nasopharynx is within normal limits. Tongue base and soft
palate are within normal limits. Vallecula and epiglottis are within
normal limits. Aryepiglottic folds and piriform sinuses are clear.
Vocal cords are midline and symmetric. Trachea is clear.

Salivary glands: The submandibular and parotid glands and ducts are
within normal limits.

Thyroid: Normal

Lymph nodes: No significant cervical adenopathy is present.

Vascular: Slight wall irregularity is present along the posterior
aspect of the proximal left ICA consistent with noncalcified plaque.
No significant stenosis is present.

Limited intracranial: Within normal limits.

Visualized orbits: The globes and orbits are within normal limits.

Mastoids and visualized paranasal sinuses: The paranasal sinuses and
mastoid air cells are clear.

Skeleton: Minimal uncovertebral spurring scratched at uncovertebral
spurring is present with right greater than left foraminal narrowing
at C6-7. Minimal uncovertebral spurring is present on the right at
C4-5. No focal osseous lesions are present. Alignment is anatomic.

Upper chest: The lung apices are clear.

Other: No focal lesion is evident subjacent to the area marked.
There is slight thickening of the posterior cervical fascia which
could be palpable. No lipoma or other mass lesion is present.
IMPRESSION: 1. Slight thickening of the posterior cervical fascia could be
palpable. No lipoma or other mass lesion is present. No focal lesion
is evident at the area marked.
2. Minimal degenerative changes of the cervical spine are most
evident at C6-7 and C4-5.

## 2022-01-23 DIAGNOSIS — D225 Melanocytic nevi of trunk: Secondary | ICD-10-CM | POA: Diagnosis not present

## 2022-01-23 DIAGNOSIS — L570 Actinic keratosis: Secondary | ICD-10-CM | POA: Diagnosis not present

## 2022-01-23 DIAGNOSIS — L820 Inflamed seborrheic keratosis: Secondary | ICD-10-CM | POA: Diagnosis not present

## 2022-01-23 DIAGNOSIS — D1801 Hemangioma of skin and subcutaneous tissue: Secondary | ICD-10-CM | POA: Diagnosis not present

## 2022-01-23 DIAGNOSIS — D2261 Melanocytic nevi of right upper limb, including shoulder: Secondary | ICD-10-CM | POA: Diagnosis not present

## 2022-01-23 DIAGNOSIS — D2262 Melanocytic nevi of left upper limb, including shoulder: Secondary | ICD-10-CM | POA: Diagnosis not present

## 2022-04-12 DIAGNOSIS — H60331 Swimmer's ear, right ear: Secondary | ICD-10-CM | POA: Diagnosis not present

## 2022-04-12 DIAGNOSIS — H6121 Impacted cerumen, right ear: Secondary | ICD-10-CM | POA: Diagnosis not present

## 2024-03-19 ENCOUNTER — Ambulatory Visit: Admission: EM | Admit: 2024-03-19 | Discharge: 2024-03-19 | Disposition: A | Discharge status: Home / Self Care

## 2024-03-19 DIAGNOSIS — J029 Acute pharyngitis, unspecified: Secondary | ICD-10-CM

## 2024-03-19 LAB — POCT RAPID STREP A (OFFICE): Rapid Strep A Screen: NEGATIVE

## 2024-03-19 LAB — POCT MONO SCREEN (KUC): Mono, POC: NEGATIVE

## 2024-03-19 NOTE — Discharge Instructions (Addendum)
 The strep and mono tests are negative.    Follow-up with your primary care provider if your symptoms are not improving.

## 2024-03-19 NOTE — ED Triage Notes (Signed)
 Patient to Urgent Care with complaints of sore throat/ fatigue/ headaches. Denies any fevers.  Reports symptoms started three days ago.  No otc meds.

## 2024-03-19 NOTE — ED Provider Notes (Signed)
 Arlander Bellman    CSN: [id number] Arrival date & time: 03/19/24  1558      History   Chief Complaint Chief Complaint  Patient presents with   Sore Throat   Headache    HPI BRENNYN HAISLEY is a 48 y.o. male.  Patient presents with sore throat, headache, fatigue x 3 days.  No fever, rash, cough, shortness of breath.  No OTC medications taken.  Patient is concerned because he has a family member who has mono.  The history is provided by the patient and medical records.    Past Medical History:  Diagnosis Date   Chicken pox     There are no active problems to display for this patient.   Past Surgical History:  Procedure Laterality Date   WISDOM TOOTH EXTRACTION         Home Medications    Prior to Admission medications   Medication Sig Start Date End Date Taking? Authorizing Provider  nicotine (NICODERM CQ - DOSED IN MG/24 HOURS) 14 mg/24hr patch 14 mg daily. 03/14/24  Yes [provider]  Creatine POWD Take by mouth. Not at this time    [provider]  ELDERBERRY PO Take by mouth daily.     [provider]  Melatonin 1 MG TABS Take by mouth.    [provider]  Multiple Vitamin (MULTIVITAMIN) tablet Take 1 tablet by mouth daily.    [provider]  nicotine polacrilex (NICORETTE) 4 MG gum Take 4 mg by mouth as needed for smoking cessation.    [provider]  Omega-3 1000 MG CAPS Take 2 capsules by mouth daily.    [provider]  OVER THE COUNTER MEDICATION daily. Tumeric     [provider]  Protein POWD Take by mouth.    [provider]    Family History Family History  Problem Relation Age of Onset   Alcohol  abuse Father    Cancer Father        colon   Colon cancer Father    Cancer Maternal Grandfather        liver   Esophageal cancer Neg Hx    Stomach cancer Neg Hx    Rectal cancer Neg Hx     Social History Social History   Tobacco Use   Smoking status: Some  Days    Current packs/day: 0.50    Average packs/day: 0.5 packs/day for 15.0 years (7.5 ttl pk-yrs)    Types: Cigarettes   Smokeless tobacco: Former    Types: Snuff   Tobacco comments:    quit 2016  Vaping Use   Vaping status: Never Used  Substance Use Topics   Alcohol  use: Yes    Alcohol /week: 5.0 standard drinks of alcohol     Types: 5 Cans of beer per week    Comment: occasional   Drug use: No     Allergies   Penicillins   Review of Systems Review of Systems  Constitutional:  Positive for fatigue. Negative for chills and fever.  HENT:  Positive for sore throat. Negative for ear pain.   Respiratory:  Negative for cough and shortness of breath.   Neurological:  Positive for headaches.     Physical Exam Triage Vital Signs ED Triage Vitals  Encounter Vitals Group     BP 03/19/24 1612 135/80     Systolic BP Percentile --      Diastolic BP Percentile --      Pulse Rate 03/19/24 1612  81     Resp 03/19/24 1612 18     Temp 03/19/24 1612 99.6 F (37.6 C)     Temp src --      SpO2 03/19/24 1612 97 %     Weight --      Height --      Head Circumference --      Peak Flow --      Pain Score 03/19/24 1613 5     Pain Loc --      Pain Education --      Exclude from Growth Chart --    No data found.  Updated Vital Signs BP 135/80   Pulse 81   Temp 99.6 F (37.6 C)   Resp 18   SpO2 97%   Visual Acuity Right Eye Distance:   Left Eye Distance:   Bilateral Distance:    Right Eye Near:   Left Eye Near:    Bilateral Near:     Physical Exam Constitutional:      General: He is not in acute distress. HENT:     Right Ear: Tympanic membrane normal.     Left Ear: Tympanic membrane normal.     Nose: Nose normal.     Mouth/Throat:     Mouth: Mucous membranes are moist.     Pharynx: Posterior oropharyngeal erythema present.  Cardiovascular:     Rate and Rhythm: Normal rate and regular rhythm.     Heart sounds: Normal heart sounds.  Pulmonary:     Effort:  Pulmonary effort is normal. No respiratory distress.     Breath sounds: Normal breath sounds.  Neurological:     Mental Status: He is alert.      UC Treatments / Results  Labs (all labs ordered are listed, but only abnormal results are displayed) Labs Reviewed  POCT RAPID STREP A (OFFICE)  POCT MONO SCREEN ([facility])    EKG   Radiology No results found.  Procedures Procedures (including critical care time)  Medications Ordered in UC Medications - No data to display  Initial Impression / Assessment and Plan / UC Course  I have reviewed the triage vital signs and the nursing notes.  Pertinent labs & imaging results that were available during my care of the patient were reviewed by me and considered in my medical decision making (see chart for details).    Viral pharyngitis.  Afebrile and vital signs are stable.  Rapid strep negative.  Per patient request, mono tested and is negative.  Discussed symptomatic treatment including Tylenol or ibuprofen as needed.  Instructed patient to follow-up with his PCP if he is not improving.  He agrees to plan of care.  Final Clinical Impressions(s) / UC Diagnoses   Final diagnoses:  Viral pharyngitis     Discharge Instructions      The strep and mono tests are negative.    Follow-up with your primary care provider if your symptoms are not improving.    ED Prescriptions   None    PDMP not reviewed this encounter.   Wellington Half, NP 03/19/24 [phone]

## 2024-05-16 ENCOUNTER — Encounter: Payer: Self-pay | Admitting: Advanced Practice Midwife
